# Patient Record
Sex: Female | Born: 1990 | Race: White | Hispanic: No | Marital: Single | Smoking: Never smoker
Health system: Southern US, Community
[De-identification: ages and names within clinical notes are randomized; demographics above are authoritative.]

## PROBLEM LIST (undated history)

## (undated) DIAGNOSIS — T7840XA Allergy, unspecified, initial encounter: Secondary | ICD-10-CM

## (undated) HISTORY — DX: Allergy, unspecified, initial encounter: T78.40XA

## (undated) HISTORY — PX: CHOLECYSTECTOMY: SHX55

## (undated) HISTORY — PX: HERNIA REPAIR: SHX51

---
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
## 2011-03-27 ENCOUNTER — Encounter (INDEPENDENT_AMBULATORY_CARE_PROVIDER_SITE_OTHER): Payer: Self-pay | Admitting: Surgery

## 2011-04-05 ENCOUNTER — Ambulatory Visit (INDEPENDENT_AMBULATORY_CARE_PROVIDER_SITE_OTHER): Payer: Federal, State, Local not specified - PPO | Admitting: Surgery

## 2011-04-05 DIAGNOSIS — Z9049 Acquired absence of other specified parts of digestive tract: Secondary | ICD-10-CM

## 2011-04-05 DIAGNOSIS — Z9889 Other specified postprocedural states: Secondary | ICD-10-CM

## 2011-04-05 NOTE — Progress Notes (Signed)
Subjective:     Patient ID: Jenna Murray, female   DOB: 22-Nov-1990, 20 y.o.   MRN: [id number]    There were no vitals taken for this visit.    HPI  She is here for a postoperative visit status post left upper cholecystectomy for gallstones. She is doing well. She is moving her bowels well and eating well and has no complaints. Review of Systems     Objective:   Physical Exam    On examination, her abdomen is soft and her incisions are well healed.  The final pathology showed chronic cholecystitis with cholelithiasis. Assessment:     Patient doing well status post laparoscopic cholecystectomy    Plan:        She may resume her normal activities. I will see her back as needed.

## 2012-06-14 IMAGING — CT CT ABD-PELV W/ CM
1 of 2 series · 15 of 32 positions shown, 19 images · IV contrast (APPLIED)
Comparison: None

CLINICAL DATA: Abdominal pain.

CT ABDOMEN AND PELVIS WITH CONTRAST
TECHNIQUE: Multidetector CT imaging of the abdomen and pelvis was
performed following the standard protocol during bolus
administration of intravenous contrast.
Contrast: 80 ml Omnipaque 300 IV.

[Series 2: abd/pelv with 5.0 b31f st · axial · 0.62mm/px · z∈[+918,+1312]mm · 15 of 87 slices shown, 19 images]
[im 4/87  soft-tissue]
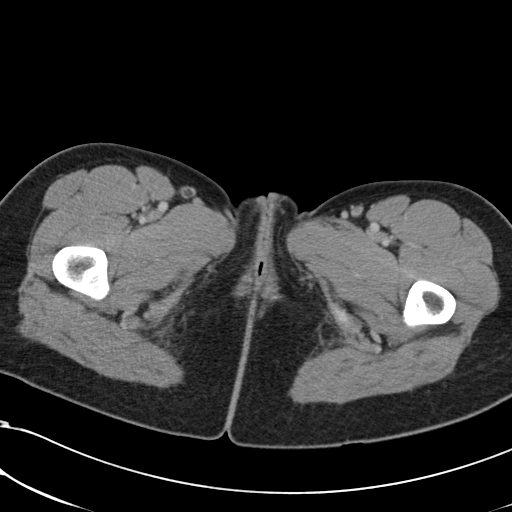
[im 4/87  bone]
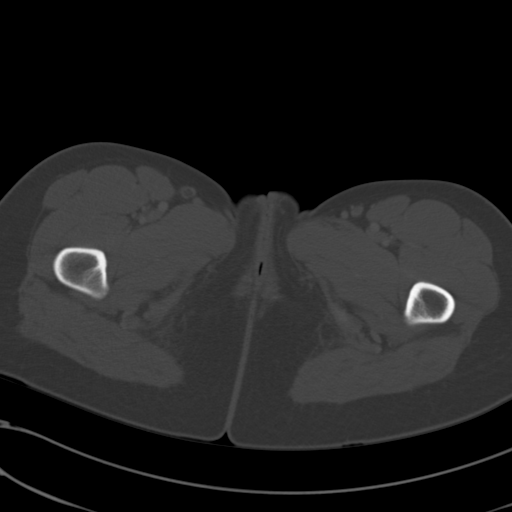
[im 12/87  soft-tissue]
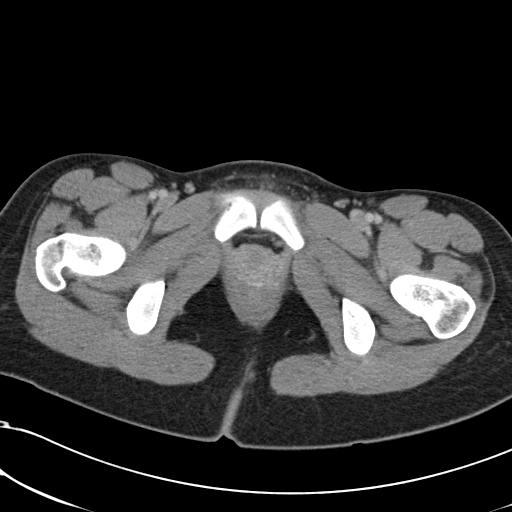
[im 20/87  soft-tissue]
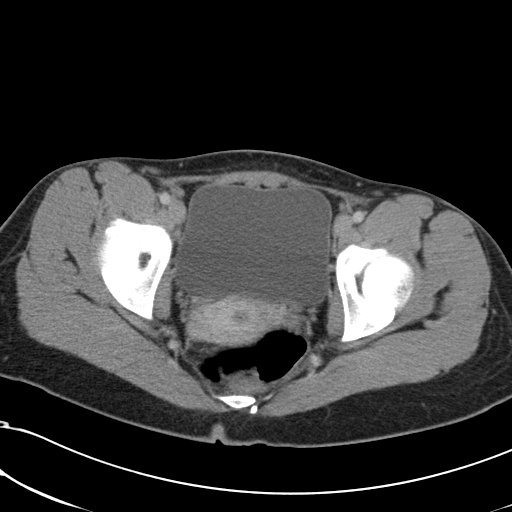
[im 24/87  soft-tissue]
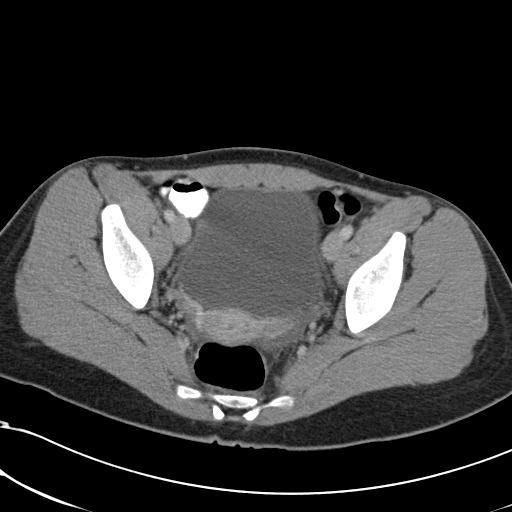
[im 32/87  soft-tissue]
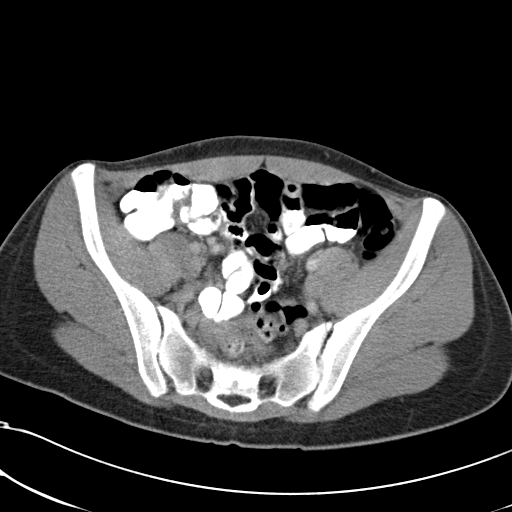
[im 36/87  soft-tissue]
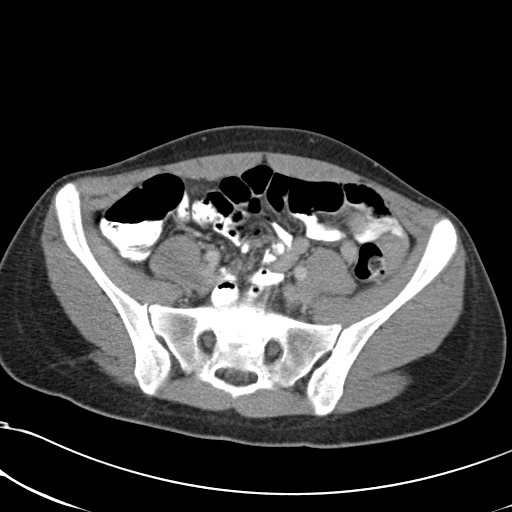
[im 44/87  soft-tissue]
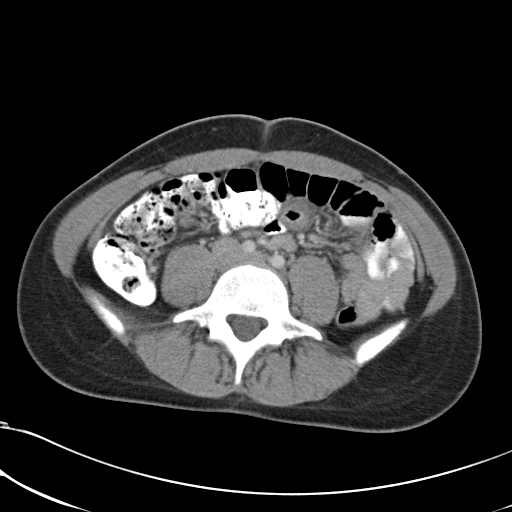
[im 51/87  soft-tissue]
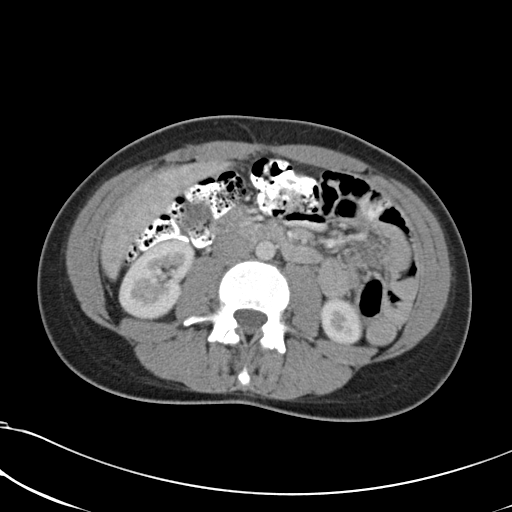
[im 55/87  soft-tissue]
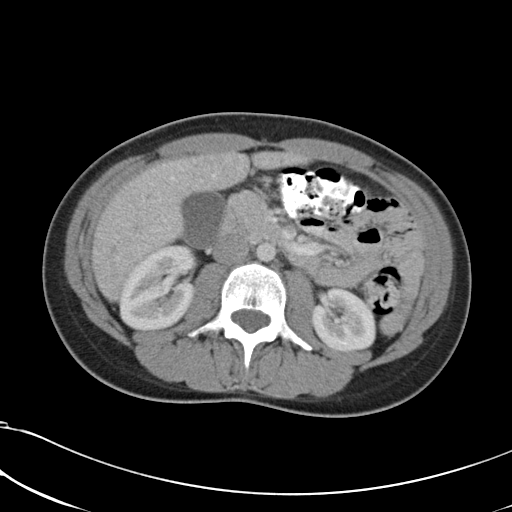
[im 55/87  bone]
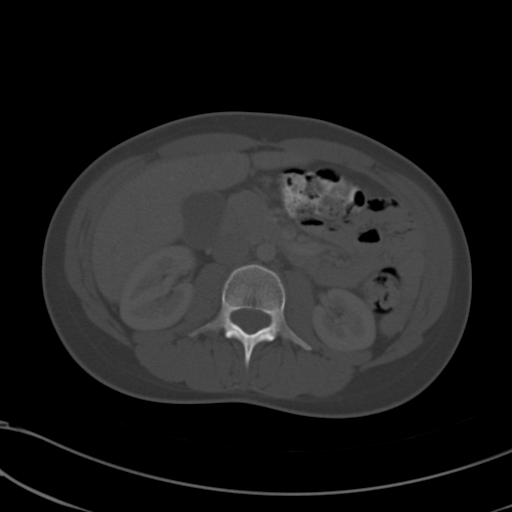
[im 63/87  soft-tissue]
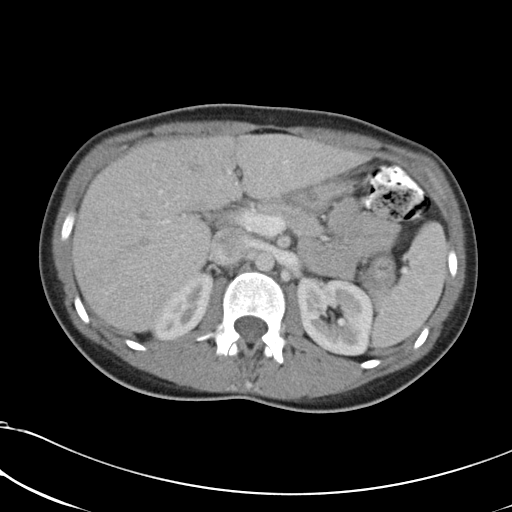
[im 67/87  soft-tissue]
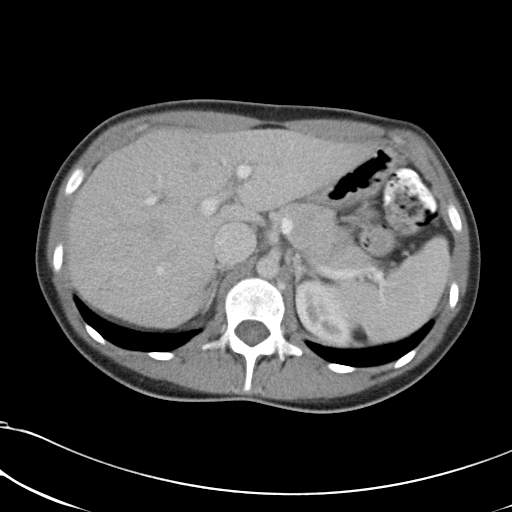
[im 71/87  lung]
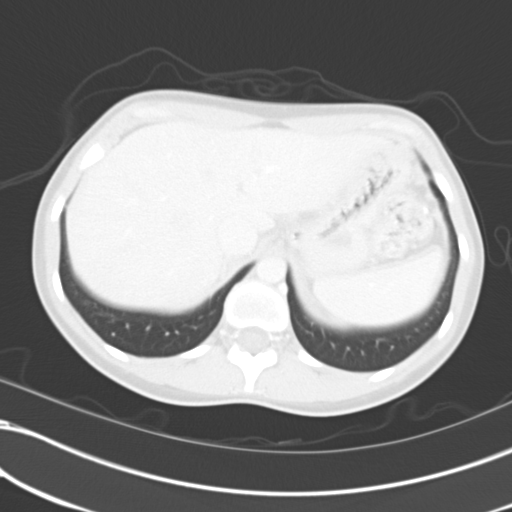
[im 75/87  soft-tissue]
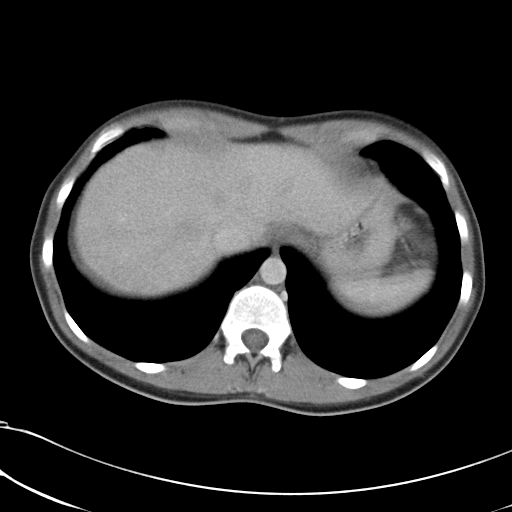
[im 75/87  lung]
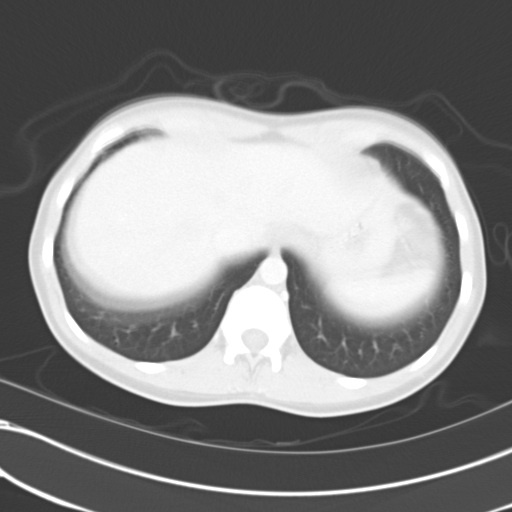
[im 79/87  lung]
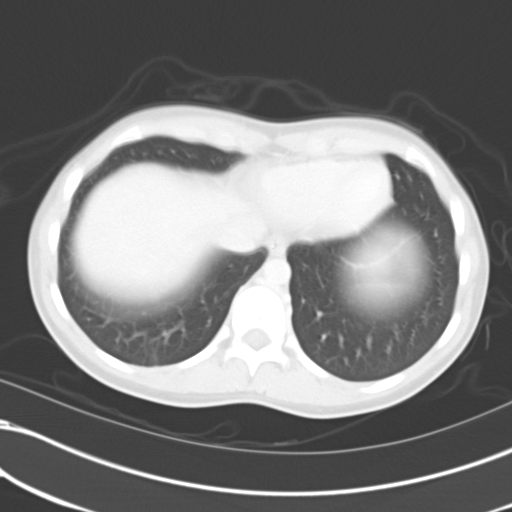
[im 83/87  soft-tissue]
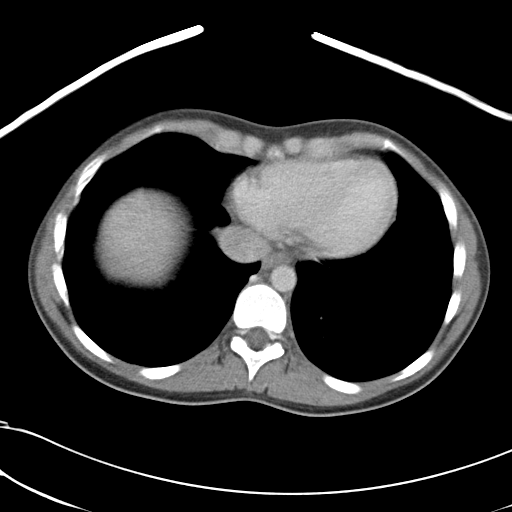
[im 83/87  lung]
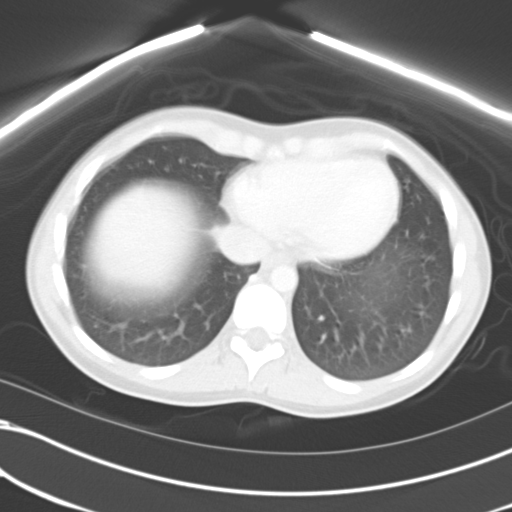

[15 of 32 positions shown; findings below may reference images not displayed]

FINDINGS: Lung bases are clear.  No effusions.  Heart is normal
size.

17 mm low density lesion is seen peripherally in the right hepatic
lobe.  There is suggestion of peripheral contrast enhancement
suggesting a hemangioma.  Liver otherwise unremarkable.
Gallbladder, spleen, pancreas, adrenals and kidneys are normal.

There is an abnormally thickened small bowel loop in the left
abdomen.  This is compatible with infectious or inflammatory
enteritis.  The remainder of the small bowel is unremarkable.
Colon grossly unremarkable.  Appendix is visualized and is normal.

Uterus, adnexa and urinary bladder normal.  No free fluid, free air
or adenopathy.
IMPRESSION: Abnormal wall thickening of a loop of jejunum compatible with
infectious or inflammatory enteritis.

Normal appendix.

Probable hemangioma within the liver.

## 2014-04-23 ENCOUNTER — Other Ambulatory Visit (HOSPITAL_COMMUNITY)
Admission: RE | Admit: 2014-04-23 | Discharge: 2014-04-23 | Disposition: A | Discharge status: Home / Self Care | Payer: Federal, State, Local not specified - PPO | Source: Ambulatory Visit | Attending: Obstetrics & Gynecology | Admitting: Obstetrics & Gynecology

## 2014-04-23 ENCOUNTER — Other Ambulatory Visit: Payer: Self-pay | Admitting: Obstetrics & Gynecology

## 2014-04-23 DIAGNOSIS — Z01419 Encounter for gynecological examination (general) (routine) without abnormal findings: Secondary | ICD-10-CM | POA: Insufficient documentation

## 2014-04-26 LAB — CYTOLOGY - PAP

## 2014-08-06 ENCOUNTER — Ambulatory Visit (INDEPENDENT_AMBULATORY_CARE_PROVIDER_SITE_OTHER): Payer: Federal, State, Local not specified - PPO | Admitting: Internal Medicine

## 2014-08-06 DIAGNOSIS — Z9189 Other specified personal risk factors, not elsewhere classified: Secondary | ICD-10-CM

## 2014-08-06 MED ORDER — ATOVAQUONE-PROGUANIL HCL 250-100 MG PO TABS: 1.0000 | ORAL_TABLET | Freq: Every day | ORAL | Status: DC

## 2014-08-06 MED ORDER — CIPROFLOXACIN HCL 500 MG PO TABS: 500.0000 mg | ORAL_TABLET | Freq: Two times a day (BID) | ORAL | Status: DC

## 2014-08-06 NOTE — Progress Notes (Signed)
  Rfv: pre travel counseling for going to [place] Subjective:    Patient ID: Jenna Murray, female    DOB: September 28, 1990, 23 y.o.   MRN: [id number]  HPI 23yo F who is in good state of health. She is going to [place] from Dec 13 thru Jan 7th. Visiting her boyfriends family who reside in [place], but also will be going to [place] for long weekend.  She has traveled to [place] in 2013-2014. Received typhoid vaccine in 2013. Previously took malaria proph without difficulty. Had to be hospitalized for traveler's diarrhea in [place]  Past vaccine : typhoid, flu, hep b, flu, tdap. Polio, hpv, hib. Mmr, polio. She vaguely recalls getting hep a but it is not listed in vaccination record  Review of Systems     Objective:   Physical Exam        Assessment & Plan:  Pre travel vaccination = typhoid still valid until 2016. Recommend hep a if not received. Recommended to look at her records  Traveler's diarrhea = gave rx for cipro  Malaria risk = travel to [place] is recommended to get prophylaxis gave rx for #12 malarone in addition to using deet and premethrin spray

## 2015-04-14 ENCOUNTER — Encounter: Payer: Self-pay | Admitting: Podiatry

## 2015-04-14 ENCOUNTER — Ambulatory Visit (INDEPENDENT_AMBULATORY_CARE_PROVIDER_SITE_OTHER): Payer: Federal, State, Local not specified - PPO | Admitting: Podiatry

## 2015-04-14 ENCOUNTER — Telehealth: Payer: Self-pay | Admitting: *Deleted

## 2015-04-14 VITALS — BP 105/59 | HR 65 | Resp 16 | Ht 65.0 in | Wt 125.0 lb

## 2015-04-14 DIAGNOSIS — M21829 Other specified acquired deformities of unspecified upper arm: Secondary | ICD-10-CM | POA: Diagnosis not present

## 2015-04-14 DIAGNOSIS — M2010 Hallux valgus (acquired), unspecified foot: Secondary | ICD-10-CM

## 2015-04-14 DIAGNOSIS — L600 Ingrowing nail: Secondary | ICD-10-CM

## 2015-04-14 MED ORDER — NEOMYCIN-POLYMYXIN-HC 1 % OT SOLN: OTIC | Status: AC

## 2015-04-14 NOTE — Patient Instructions (Signed)

## 2015-04-14 NOTE — Telephone Encounter (Signed)
Pt asked if she needed to be out of work after toenail procedure and if she needed a note.  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  Pt states if she decided to be out of work could she get a note and I told her I could write her a note, to let me know.

## 2015-04-14 NOTE — Progress Notes (Signed)
   Subjective:    Patient ID: Jenna Murray, female    DOB: [birth year], 24 y.o.   MRN: [id number]  HPI Comments: "I have an ingrown"  Patient c/o tender 1st toe bilateral, both borders, for about 6 months. She tries to trim the corners out. She soaks occassionally.  Toe Pain       Review of Systems  All other systems reviewed and are negative.      Objective:   Physical Exam  I have reviewed his past medical history medications allergy surgery social history and review of systems. Pulses are strongly palpable. Neurologic sensorium is intact per Semmes-Weinstein monofilament. Deep tendon reflexes are intact bilateral muscle strength +5 over 5 dorsiflexion plantar flexors and inverters everters all intrinsic musculature is intact. Orthopedic evaluation of his traits all joints distal to the ankle range of motion without crepitation. Cutaneous evaluation demonstrates supple well-hydrated cutis sharply incurvated nail margins.        Assessment & Plan:  Assessment: Ingrown nail hallux bilateral   Plan: Chemical matrixectomy was performed today after local anesthesia was achieved. Patient tolerated procedure well as the nail was split from distal to proximal avulsed margins exposed the matrix of the nailbed to the phenol. Phenol was applied 30 seconds each 3 and was neutralized with isopropyl all all. Patient will start soaking it twice a day in Betadine warm water and apply Cortisporin Otic as directed. These instructions were provided and reviewed.

## 2015-04-21 ENCOUNTER — Encounter: Payer: Self-pay | Admitting: Podiatry

## 2015-04-21 ENCOUNTER — Ambulatory Visit (INDEPENDENT_AMBULATORY_CARE_PROVIDER_SITE_OTHER): Payer: Federal, State, Local not specified - PPO | Admitting: Podiatry

## 2015-04-21 VITALS — BP 93/60 | HR 72 | Resp 15

## 2015-04-21 DIAGNOSIS — M2580 Other specified joint disorders, unspecified joint: Secondary | ICD-10-CM

## 2015-04-21 DIAGNOSIS — L600 Ingrowing nail: Secondary | ICD-10-CM

## 2015-04-21 NOTE — Patient Instructions (Signed)

## 2015-04-21 NOTE — Progress Notes (Signed)
This patient presents today for follow-up matrixectomy. They continue to soak twice daily and apply Cortisporin otic as directed. Relating no complaints.  Objective: Vital signs are stable. Secondly site appears to be healing well without erythema or drainage purulence or odor.  Assessment: Well-healing surgical matrixectomy without complications.  Plan: Currently we will discontinue the use of Betadine soaks and start with Epsom salts and water twice daily. Continue the use of Cortisporin Otic and covered during the day leaving it open at night time. They will continue to soak the toe until completely well. They will continue to watch the toe for signs and symptoms of infection should any arise we will be notified immediately. Follow-up when necessary.  

## 2015-04-28 ENCOUNTER — Ambulatory Visit: Payer: Federal, State, Local not specified - PPO | Admitting: Podiatry

## 2015-09-20 ENCOUNTER — Telehealth: Payer: Self-pay
